# Patient Record
Sex: Male | Born: 1940 | Race: White | Hispanic: No
Health system: Southern US, Community
[De-identification: ages and names within clinical notes are randomized; demographics above are authoritative.]

---

## 2005-03-01 ENCOUNTER — Ambulatory Visit: Payer: Self-pay | Admitting: Internal Medicine

## 2012-08-23 ENCOUNTER — Emergency Department: Payer: Self-pay | Admitting: Emergency Medicine

## 2014-03-27 ENCOUNTER — Emergency Department: Payer: Self-pay | Admitting: Emergency Medicine

## 2014-03-27 LAB — URINALYSIS, COMPLETE
Bacteria: NONE SEEN
Bilirubin,UR: NEGATIVE
Blood: NEGATIVE
GLUCOSE, UR: NEGATIVE mg/dL (ref 0–75)
Hyaline Cast: 1
Ketone: NEGATIVE
Leukocyte Esterase: NEGATIVE
NITRITE: NEGATIVE
PH: 5 (ref 4.5–8.0)
Protein: NEGATIVE
RBC,UR: <1 /HPF (ref 0–5)
Specific Gravity: 1.02 (ref 1.003–1.030)
Squamous Epithelial: NONE SEEN
WBC UR: <1 /HPF (ref 0–5)

## 2014-03-27 LAB — COMPREHENSIVE METABOLIC PANEL
ALK PHOS: 52 U/L
ALT: 34 U/L (ref 12–78)
AST: 37 U/L (ref 15–37)
Albumin: 3.5 g/dL (ref 3.4–5.0)
Anion Gap: 7 (ref 7–16)
BILIRUBIN TOTAL: 0.3 mg/dL (ref 0.2–1.0)
BUN: 17 mg/dL (ref 7–18)
CO2: 27 mmol/L (ref 21–32)
CREATININE: 0.91 mg/dL (ref 0.60–1.30)
Calcium, Total: 8.2 mg/dL — ABNORMAL LOW (ref 8.5–10.1)
Chloride: 105 mmol/L (ref 98–107)
EGFR (African American): >60
Glucose: 180 mg/dL — ABNORMAL HIGH (ref 65–99)
OSMOLALITY: 284 (ref 275–301)
POTASSIUM: 4.5 mmol/L (ref 3.5–5.1)
Sodium: 139 mmol/L (ref 136–145)
Total Protein: 6.5 g/dL (ref 6.4–8.2)

## 2014-03-27 LAB — CBC
HCT: 38.1 % — ABNORMAL LOW (ref 40.0–52.0)
HGB: 12.7 g/dL — ABNORMAL LOW (ref 13.0–18.0)
MCH: 31.6 pg (ref 26.0–34.0)
MCHC: 33.3 g/dL (ref 32.0–36.0)
MCV: 95 fL (ref 80–100)
Platelet: 214 10*3/uL (ref 150–440)
RBC: 4.01 10*6/uL — AB (ref 4.40–5.90)
RDW: 13.6 % (ref 11.5–14.5)
WBC: 6.6 10*3/uL (ref 3.8–10.6)

## 2014-03-27 LAB — LIPASE, BLOOD: Lipase: 54 U/L — ABNORMAL LOW (ref 73–393)

## 2014-06-18 ENCOUNTER — Ambulatory Visit: Payer: Self-pay | Admitting: Internal Medicine

## 2014-09-28 ENCOUNTER — Emergency Department: Payer: Self-pay | Admitting: Emergency Medicine

## 2014-09-28 LAB — BASIC METABOLIC PANEL
ANION GAP: 10 (ref 7–16)
BUN: 7 mg/dL (ref 7–18)
CALCIUM: 8 mg/dL — AB (ref 8.5–10.1)
CO2: 23 mmol/L (ref 21–32)
Chloride: 97 mmol/L — ABNORMAL LOW (ref 98–107)
Creatinine: 0.55 mg/dL — ABNORMAL LOW (ref 0.60–1.30)
EGFR (Non-African Amer.): >60
Glucose: 148 mg/dL — ABNORMAL HIGH (ref 65–99)
Osmolality: 262 (ref 275–301)
POTASSIUM: 4.7 mmol/L (ref 3.5–5.1)
Sodium: 130 mmol/L — ABNORMAL LOW (ref 136–145)

## 2014-09-28 LAB — CBC
HCT: 41.7 % (ref 40.0–52.0)
HGB: 14 g/dL (ref 13.0–18.0)
MCH: 31.8 pg (ref 26.0–34.0)
MCHC: 33.5 g/dL (ref 32.0–36.0)
MCV: 95 fL (ref 80–100)
Platelet: 299 10*3/uL (ref 150–440)
RBC: 4.4 10*6/uL (ref 4.40–5.90)
RDW: 12.9 % (ref 11.5–14.5)
WBC: 11.5 10*3/uL — ABNORMAL HIGH (ref 3.8–10.6)

## 2014-09-28 LAB — URINALYSIS, COMPLETE
BLOOD: NEGATIVE
Bacteria: NONE SEEN
Bilirubin,UR: NEGATIVE
Glucose,UR: NEGATIVE mg/dL (ref 0–75)
Ketone: NEGATIVE
Leukocyte Esterase: NEGATIVE
NITRITE: NEGATIVE
PH: 7 (ref 4.5–8.0)
Protein: 30
SPECIFIC GRAVITY: 1.01 (ref 1.003–1.030)
Squamous Epithelial: NONE SEEN

## 2014-09-28 LAB — TROPONIN I: Troponin-I: <0.02 ng/mL

## 2014-09-29 ENCOUNTER — Emergency Department: Payer: Self-pay | Admitting: Emergency Medicine

## 2014-09-29 LAB — COMPREHENSIVE METABOLIC PANEL
ALT: 29 U/L
Albumin: 3.9 g/dL (ref 3.4–5.0)
Alkaline Phosphatase: 65 U/L
Anion Gap: 6 — ABNORMAL LOW (ref 7–16)
BUN: 15 mg/dL (ref 7–18)
Bilirubin,Total: 0.6 mg/dL (ref 0.2–1.0)
CO2: 28 mmol/L (ref 21–32)
CREATININE: 0.92 mg/dL (ref 0.60–1.30)
Calcium, Total: 8.5 mg/dL (ref 8.5–10.1)
Chloride: 99 mmol/L (ref 98–107)
Glucose: 121 mg/dL — ABNORMAL HIGH (ref 65–99)
Osmolality: 268 (ref 275–301)
POTASSIUM: 3.8 mmol/L (ref 3.5–5.1)
SGOT(AST): 28 U/L (ref 15–37)
SODIUM: 133 mmol/L — AB (ref 136–145)
Total Protein: 7 g/dL (ref 6.4–8.2)

## 2014-09-29 LAB — CBC
HCT: 41.8 % (ref 40.0–52.0)
HGB: 13.8 g/dL (ref 13.0–18.0)
MCH: 31.8 pg (ref 26.0–34.0)
MCHC: 33.1 g/dL (ref 32.0–36.0)
MCV: 96 fL (ref 80–100)
Platelet: 284 10*3/uL (ref 150–440)
RBC: 4.34 10*6/uL — ABNORMAL LOW (ref 4.40–5.90)
RDW: 13.1 % (ref 11.5–14.5)
WBC: 9.2 10*3/uL (ref 3.8–10.6)

## 2014-09-29 LAB — URINALYSIS, COMPLETE
Bacteria: NONE SEEN
Bilirubin,UR: NEGATIVE
Blood: NEGATIVE
Glucose,UR: NEGATIVE mg/dL (ref 0–75)
KETONE: NEGATIVE
Leukocyte Esterase: NEGATIVE
Nitrite: NEGATIVE
PH: 6 (ref 4.5–8.0)
Protein: NEGATIVE
SPECIFIC GRAVITY: 1.026 (ref 1.003–1.030)
Squamous Epithelial: <1
WBC UR: 1 /HPF (ref 0–5)

## 2014-09-29 LAB — CARBAMAZEPINE LEVEL, TOTAL: Carbamazepine: 10.9 ug/mL (ref 4.0–12.0)

## 2014-09-29 LAB — DRUG SCREEN, URINE
AMPHETAMINES, UR SCREEN: NEGATIVE (ref ?–1000)
BENZODIAZEPINE, UR SCRN: POSITIVE (ref ?–200)
Barbiturates, Ur Screen: NEGATIVE (ref ?–200)
COCAINE METABOLITE, UR ~~LOC~~: NEGATIVE (ref ?–300)
Cannabinoid 50 Ng, Ur ~~LOC~~: NEGATIVE (ref ?–50)
MDMA (Ecstasy)Ur Screen: NEGATIVE (ref ?–500)
Methadone, Ur Screen: NEGATIVE (ref ?–300)
Opiate, Ur Screen: POSITIVE (ref ?–300)
Phencyclidine (PCP) Ur S: NEGATIVE (ref ?–25)
Tricyclic, Ur Screen: NEGATIVE (ref ?–1000)

## 2014-09-29 LAB — ACETAMINOPHEN LEVEL

## 2014-09-29 LAB — ETHANOL: Ethanol: 3 mg/dL

## 2014-09-29 LAB — SALICYLATE LEVEL: Salicylates, Serum: <1.7 mg/dL

## 2015-03-12 NOTE — Consult Note (Signed)
PATIENT NAME:  Jimmy Thomas, Jimmy Thomas MR#:  [id number] DATE OF BIRTH:  1941/03/15  DATE OF CONSULTATION:  09/29/2014  REFERRING PHYSICIAN:   CONSULTING PHYSICIAN:  Audery Amel, MD  IDENTIFYING INFORMATION AND REASON FOR CONSULTATION: This is a 74 year old man with a past history of a diagnosis of PTSD who was petitioned by his daughter for dangerous behavior.   CHIEF COMPLAINT: "I'd like to go home."   HISTORY OF PRESENT ILLNESS: Information obtained from the patient, from the chart and from conversation with his daughter. The daughter elaborates on what she put in the commitment paperwork to say that the patient got agitated yesterday and was arguing with her and at one point picked up a gun and was waving it around in the air in a manner that she found frightening. The patient admits that he had been holding a gun, but he denies that he had any thought at all about shooting it. Denies any homicidal ideation. When the patient briefly put the gun down, the daughter took it from him and then had him brought into the hospital. Daughter reports that he has been down for probably about a year with worsening poor function, especially worse since his wife died in 2023/02/01, but that his current confusion has probably been present for about 3 months. She has noticed that he is more confused. She suspects he is not taking his medication correctly, notices that his memory is poor. He has been refusing recommended medications. Today, she also reports that he was making delusional statements about how authorities were coming to try and take him away and acting very paranoid. The patient himself minimizes or denies this. Admits that he had a gun, but denies that he had any thought about shooting anybody. Says that his mood has been okay. He denies suicidal or homicidal ideation. The patient says he has been compliant with his medication, but he seems confused about what he is actually taking right now. He admits that he has  had some concerns that there are people in his neighborhood who are passing counterfeit money. That by itself does not necessarily sound crazy. He does talk in a somewhat disorganized way about how he is still working for the [employer] at times.   PAST PSYCHIATRIC HISTORY: Has been given a diagnosis of posttraumatic stress disorder in the past. What he describes is that he had some nightmares and flashbacks when he first got out of the [employer] many years ago, but he says that his symptoms now are almost nonexistent. He has been prescribed Librium, which he has taken chronically for decades. Does not seem to have ever been on any other medication. He denies psychiatric inpatient treatment. Denies any history of suicide attempts.   FAMILY HISTORY: Knows of no family history of mental illness.   SUBSTANCE ABUSE HISTORY: The patient denies any history of substance abuse currently or in the past. Daughter suspects that he may at times misusing his medicine, probably mostly out of confusion.   SOCIAL HISTORY: Son died couple of years ago and then his wife died this past 2023/02/01. The patient is living by himself. His daughter is his closest relative who keeps an eye on him regularly. It sounds like from what she reports his ability to take care of himself and his function has gotten a lot worse recently. She thinks he is probably taking a lot of over-the-counter sleep medicine in addition to do what he is prescribed.   PAST MEDICAL HISTORY: Apparently he has  a history of high blood pressure and cholesterol problems, also has a reported past history of seizures. 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 The patient tells me that he is not clear that they ever really thought he had seizures, but nonetheless he has been taking Tegretol for years. The patient had been in the Emergency Room just yesterday for some chest complaints and was diagnosed as having a pneumonia. He was prescribed Levaquin which he  was to take 750 mg once a day.   CURRENT MEDICATIONS: Atenolol 25 mg twice a day, Zocor 40 mg at night, Tegretol 300 mg a day, temazepam 30 mg at night, Librium 25 mg twice a day, tramadol p.r.n.   ALLERGIES: No known drug allergies.   REVIEW OF SYSTEMS: The patient denies depression. Denies suicidal or homicidal ideation. Denies hallucinations. Not reporting any acute physical symptoms. The rest of the review of systems is noncontributory.   MENTAL STATUS EXAMINATION: Disheveled gentleman who looks his stated age, who is cooperative but just barely so with the interview. Eye contact good. Psychomotor activity slow. Speech is at times slightly slurred and difficult to understand, but normal in tone. Affect is constricted at times, looks confused. Did not get hostile, but did request several times to be released home. Denies suicidal or homicidal ideation. The patient was alert and oriented to his time, place and situation. Could remember 3/3 objects immediately, only 1/3 at three minutes. Judgment and insight are probably slightly impaired at least. He is unable to really describe what he takes his medicines for. No evidence of acute suicidal or homicidal ideation. Denies hallucinations.   VITAL SIGNS: Currently, blood pressure 184/88, pulse 67, respirations 20, temperature 97.9.   LABORATORY RESULTS: Glucose elevated at 121. Sodium low at 130, otherwise normal chemistry panel: Tegretol level is 10.9, indicating that he probably has been compliant with it. Alcohol level negative. Drug screen is positive for opiates and benzodiazepines, not clear where the opiates would have come. He denies having taken any pain medicine. CBC unremarkable. Urinalysis unremarkable. Salicylates and acetaminophen unremarkable.   ASSESSMENT: A 74 year old male with a past history of posttraumatic stress disorder, daughter has petitioned him for erratic behavior with dangerousness today. Worsening functioning at home.  Confusion, probably having delusional symptoms at times by his daughter's report. Multiple possible causes, including dementia that could be leading to psychotic symptoms, effects of the acute pneumonia, acutes of his chronic taking of sedating medicine, additional medicines including the tramadol and possibly over-the-counter medicines that could be effecting him cognitively, Does not appear to be safe to go home.   TREATMENT PLAN: We are going to refer him to an inpatient geriatric psychiatry unit. For now, continue current medicines as prescribed.   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   SECONDARY DIAGNOSES:  AXIS I: Posttraumatic stress disorder by history.  AXIS III: High blood pressure; past history of seizures under control; chronic pain.     ____________________________ Audery AmelJohn T. Allin Frix, MD jtc:TT D: 09/29/2014 15:34:41 ET T: 09/29/2014 16:08:54 ET JOB#: [id number]  cc: Audery AmelJohn T. Tarin Johndrow, MD, <Dictator> Audery AmelJOHN T Addley Ballinger MD ELECTRONICALLY SIGNED 10/04/2014 17:03

## 2015-03-12 NOTE — Consult Note (Signed)
Psychiatry: PAtient seen. No new complaint. No behavior problems. Patient has been [facility] to [facility] and will be transferred under petition later today and has been informed. Taking medication and generally cooperative at this time.  Electronic Signatures: Clapacs, Jackquline DenmarkJohn T (MD)  (Signed on 12-Nov-15 12:47)  Authored  Last Updated: 12-Nov-15 12:47 by Audery Amellapacs, John T (MD)

## 2015-04-20 DEATH — deceased
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
## 2016-03-25 IMAGING — CR DG CHEST 1V PORT
1 series · 1 of 1 positions shown · non-contrast
Comparison: None.

CLINICAL DATA: High blood pressure, shortness of breath.

EXAM:
PORTABLE CHEST - 1 VIEW

[ap]
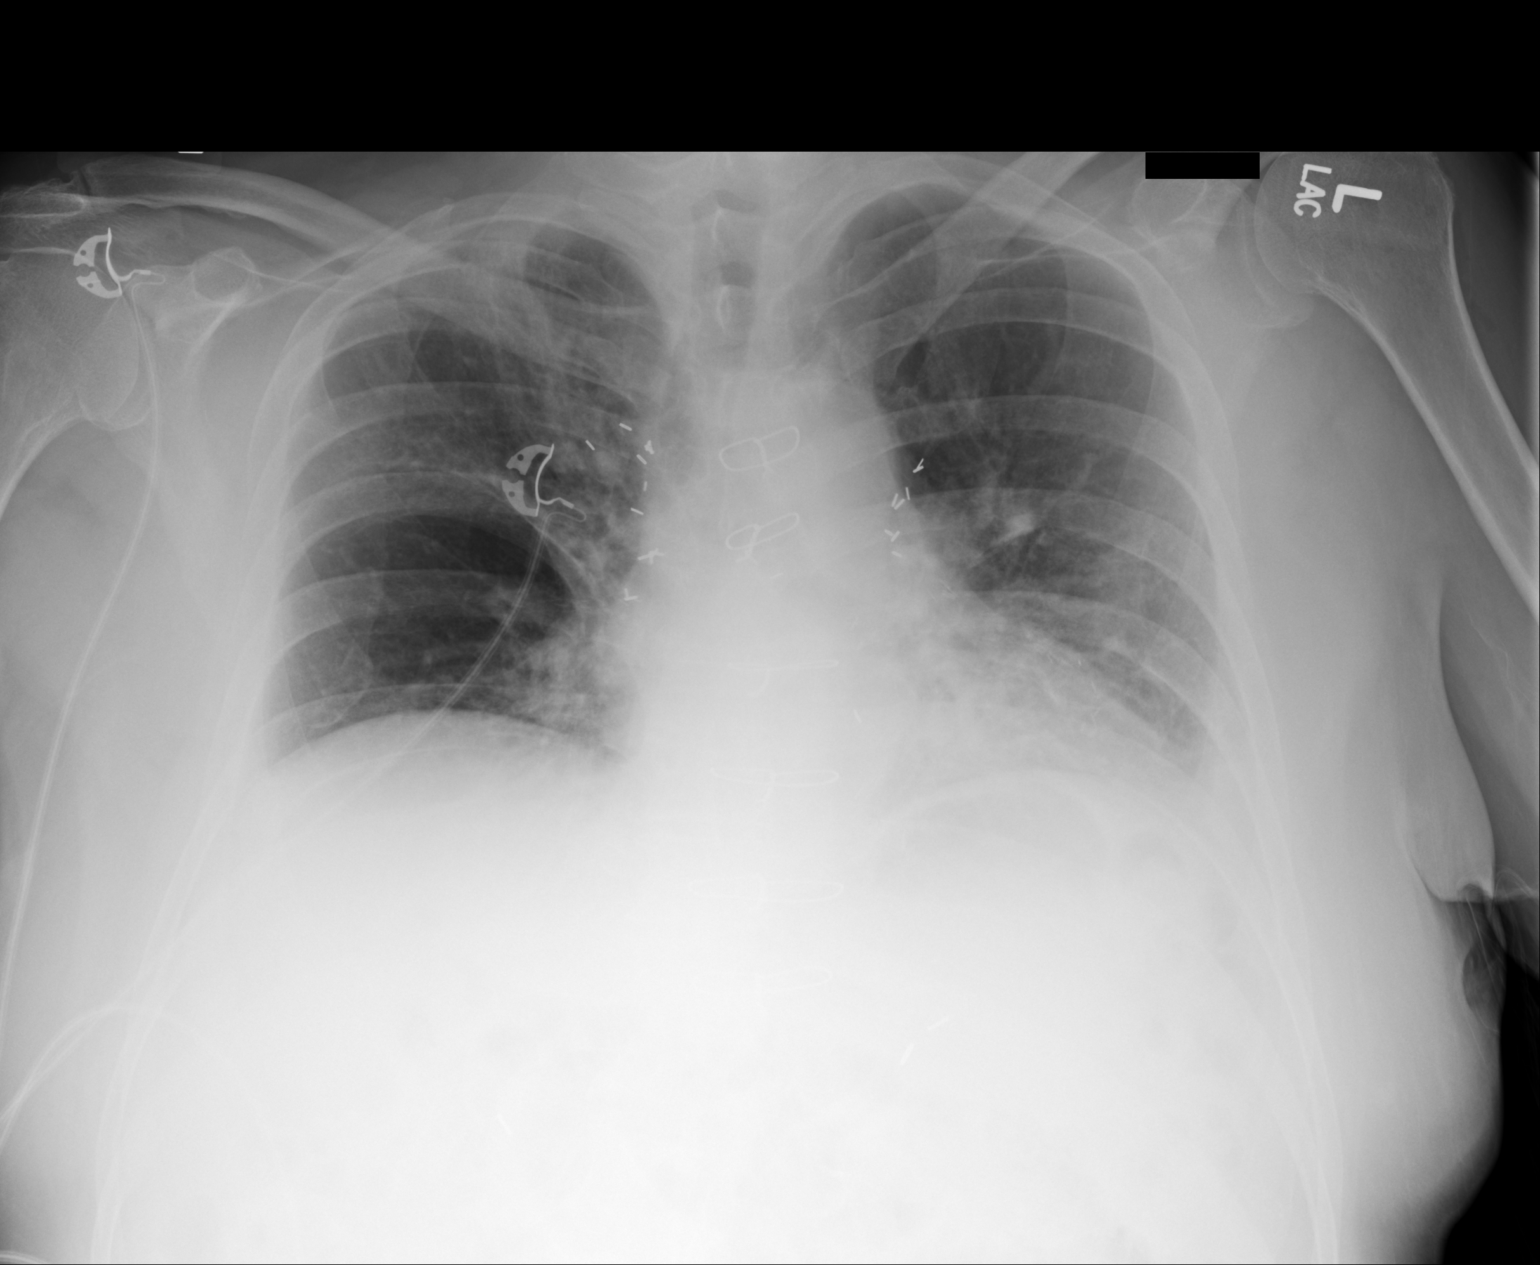

[1 of 1 positions shown; findings below may reference images not displayed]

FINDINGS: Low lung volumes. Bibasilar opacities noted, likely atelectasis in
the right base. Left basilar opacity could reflect atelectasis or
pneumonia. Heart is normal size. Prior CABG. Question small left
effusion. No acute bony abnormality.
IMPRESSION: Low lung volumes. Right base atelectasis. Left lower lobe
atelectasis or consolidation/ pneumonia. Suspect small left
effusion.
